# Patient Record
Sex: Male | Born: 2000 | Race: Black or African American | Hispanic: No | Marital: Single | Smoking: Never smoker
Health system: Southern US, Community
[De-identification: ages and names within clinical notes are randomized; demographics above are authoritative.]

---
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
## 2024-03-24 ENCOUNTER — Encounter (HOSPITAL_BASED_OUTPATIENT_CLINIC_OR_DEPARTMENT_OTHER): Payer: Self-pay | Admitting: Emergency Medicine

## 2024-03-24 ENCOUNTER — Other Ambulatory Visit: Payer: Self-pay

## 2024-03-24 ENCOUNTER — Emergency Department (HOSPITAL_BASED_OUTPATIENT_CLINIC_OR_DEPARTMENT_OTHER)
Admission: EM | Admit: 2024-03-24 | Discharge: 2024-03-24 | Disposition: A | Discharge status: Home / Self Care | Attending: Emergency Medicine | Admitting: Emergency Medicine

## 2024-03-24 DIAGNOSIS — K5900 Constipation, unspecified: Secondary | ICD-10-CM | POA: Diagnosis not present

## 2024-03-24 DIAGNOSIS — K6289 Other specified diseases of anus and rectum: Secondary | ICD-10-CM | POA: Diagnosis present

## 2024-03-24 DIAGNOSIS — K649 Unspecified hemorrhoids: Secondary | ICD-10-CM

## 2024-03-24 DIAGNOSIS — K648 Other hemorrhoids: Secondary | ICD-10-CM | POA: Diagnosis not present

## 2024-03-24 DIAGNOSIS — K625 Hemorrhage of anus and rectum: Secondary | ICD-10-CM | POA: Diagnosis not present

## 2024-03-24 MED ORDER — POLYETHYLENE GLYCOL 3350 17 GM/SCOOP PO POWD: 17.0000 g | Freq: Every day | ORAL | 0 refills | Status: AC

## 2024-03-24 MED ORDER — HYDROCORTISONE (PERIANAL) 2.5 % EX CREA: 1.0000 | TOPICAL_CREAM | Freq: Two times a day (BID) | CUTANEOUS | 0 refills | Status: AC

## 2024-03-24 MED ORDER — LIDOCAINE (ANORECTAL) 5 % EX GEL: CUTANEOUS | 0 refills | Status: AC

## 2024-03-24 NOTE — Discharge Instructions (Signed)
 It was a pleasure taking care of you here today  As we discussed on your exam you have a hemorrhoid right at the anal sphincter this is likely where the bleeding is coming from.  I started you on a suppositories to help with the pain.  I would recommend MiraLAX , which I have prescribed or you may also get over-the-counter.  2 scoops 8 ounce glass of water or drink of your choice such as Gatorade, Pedialyte.  1 glass every hour until you start having consistently soft bowel movements.  Please be aware you will most likely still have blood in the toilet when you have bowel movements due to your hemorrhoid.  Make sure to follow-up with your primary care provider as I do recommend following up with a gastroenterologist given this his been ongoing issue.  Return for any new or worsening symptoms

## 2024-03-24 NOTE — ED Triage Notes (Signed)
 Bright red blood with BM today , Hx constipation , rectal sharp pain .  Denies NV .

## 2024-03-24 NOTE — ED Provider Notes (Signed)
 " [place] [facility] Provider Note   CSN: [id number] Arrival date & time: 03/24/24  1702     Patient presents with: Rectal Bleeding   Samuel Park is a 24 y.o. male here for evaluation of rectal pain and rectal bleeding.  Patient states he has a history of constipation.  Few months ago had noted some blood after having a bowel movement, resolved.  Today had a bowel movement which she states had difficulty was due to constipation and he noted bright red blood in the toilet bowl.  He notes some sharp rectal pain.  No rectal swelling, drainage. Has been doing some diet changes to help with pain that he had a few months ago.  He has no current abdominal pain.  He has no personal or family history of colon cancer at an early age, IBD.  No nausea, vomiting, fever, lightheadedness, syncope.  He is not anticoagulated.  He does not know if he has any hemorrhoids.  Mom at bedside   HPI     Prior to Admission medications  Medication Sig Start Date End Date Taking? Authorizing Provider  hydrocortisone  (ANUSOL -HC) 2.5 % rectal cream Place 1 Application rectally 2 (two) times daily. 03/24/24  Yes Donnetta Gillin A, PA-C  Lidocaine , Anorectal, 5 % GEL Apply to rectum for pain twice daily 03/24/24  Yes Aliviya Schoeller A, PA-C  polyethylene glycol powder (MIRALAX ) 17 GM/SCOOP powder Take 17 g by mouth daily. Dissolve 1 capful (17g) in 4-8 ounces of liquid and take by mouth daily. 03/24/24  Yes Lizet Kelso A, PA-C  ibuprofen  (CHILD IBUPROFEN ) 100 MG/5ML suspension Take 20 mLs (400 mg total) by mouth every 6 (six) hours as needed for fever, mild pain or moderate pain. 07/10/14   Anitra Rocky KIDD, PA-C    Allergies: Patient has no known allergies.    Review of Systems  Constitutional: Negative.   HENT: Negative.    Respiratory: Negative.    Cardiovascular: Negative.   Gastrointestinal:  Positive for anal bleeding, constipation and rectal pain. Negative for  abdominal distention, abdominal pain, diarrhea, nausea and vomiting.  Genitourinary: Negative.   Musculoskeletal: Negative.   Skin: Negative.   Neurological: Negative.   All other systems reviewed and are negative.   Updated Vital Signs BP (!) 139/91 (BP Location: Right Arm)   Pulse 63   Temp 97.8 F (36.6 C)   Resp 16   Wt 78.5 kg   SpO2 97%   Physical Exam Vitals and nursing note reviewed. Exam conducted with a chaperone present.  Constitutional:      General: He is not in acute distress.    Appearance: He is well-developed. He is not ill-appearing, toxic-appearing or diaphoretic.  HENT:     Head: Normocephalic and atraumatic.     Nose: Nose normal.     Mouth/Throat:     Mouth: Mucous membranes are moist.  Eyes:     Pupils: Pupils are equal, round, and reactive to light.  Cardiovascular:     Rate and Rhythm: Normal rate and regular rhythm.  Pulmonary:     Effort: Pulmonary effort is normal. No respiratory distress.     Breath sounds: Normal breath sounds.  Abdominal:     General: There is no distension.     Palpations: Abdomen is soft.     Tenderness: There is no abdominal tenderness. There is no right CVA tenderness, left CVA tenderness, guarding or rebound.  Genitourinary:    Comments: Chaperone in room  for exam.  No obvious anal fissure.  No fluctuance, induration.  Hemorrhoid just internal to anal sphincter at the 6 o'clock position.  Light brown stool in rectal vault.  No perineal fluctuance, induration, erythema, warmth or crepitus Musculoskeletal:        General: Normal range of motion.     Cervical back: Normal range of motion and neck supple.  Skin:    General: Skin is warm and dry.  Neurological:     General: No focal deficit present.     Mental Status: He is alert and oriented to person, place, and time.     (all labs ordered are listed, but only abnormal results are displayed) Labs Reviewed - No data to display  EKG: None  Radiology: No results  found.   Procedures   Medications Ordered in the ED - No data to display  24 year old here for evaluation of rectal pain, bleeding and constipation.  Had something similar a few months ago did not seek treatment as it resolved.  Has been having some constipation.  Had a hard bowel movement today and noted in the toilet bowl he had bright red blood.  Here he is afebrile, nonseptic, non-ill-appearing.  He is hemodynamically stable.  He has no personal or family history of colon cancer at an early age, IBD.  On exam he has hemorrhoid just internal to the anal sphincter at the 6 o'clock position, light brown stool in rectal vault.  He has no obvious infectious process on exam.  I cannot visualize any anal fissures.  His abdomen is soft, nontender.  Discussed with patient, family at bedside.  I suspect his symptoms are likely due to constipation and hemorrhoid.  Discussed with him cannot completely rule out things like IBD, colon polyps/ CA.  Will treat his constipation, hemorrhoid.  Will hold on labs and imaging at this time given he is hemodynamically stable and has benign abdominal exam.  Low suspicion for obstruction, perforation, infectious process.  Will have him follow-up with his PCP will likely need follow-up with GI if his symptoms do not improve.  The patient has been appropriately medically screened and/or stabilized in the ED. I have low suspicion for any other emergent medical condition which would require further screening, evaluation or treatment in the ED or require inpatient management.  Patient is hemodynamically stable and in no acute distress.  Patient able to ambulate in department prior to ED.  Evaluation does not show acute pathology that would require ongoing or additional emergent interventions while in the emergency department or further inpatient treatment.  I have discussed the diagnosis with the patient and answered all questions.  Pain is been managed while in the emergency  department and patient has no further complaints prior to discharge.  Patient is comfortable with plan discussed in room and is stable for discharge at this time.  I have discussed strict return precautions for returning to the emergency department.  Patient was encouraged to follow-up with PCP/specialist refer to at discharge.                                   Medical Decision Making Amount and/or Complexity of Data Reviewed Independent Historian: parent External Data Reviewed: labs, radiology and notes.  Risk OTC drugs. Prescription drug management. Decision regarding hospitalization. Diagnosis or treatment significantly limited by social determinants of health.        Final diagnoses:  Rectal  bleeding  Hemorrhoids, unspecified hemorrhoid type  Constipation, unspecified constipation type    ED Discharge Orders          Ordered    hydrocortisone  (ANUSOL -HC) 2.5 % rectal cream  2 times daily        03/24/24 1851    polyethylene glycol powder (MIRALAX ) 17 GM/SCOOP powder  Daily        03/24/24 1851    Lidocaine , Anorectal, 5 % GEL        03/24/24 1851               River Mckercher A, PA-C 03/24/24 1955    Tegeler, Lonni PARAS, MD 03/24/24 2344  "

## 2024-03-24 NOTE — ED Notes (Signed)

## 2024-03-24 NOTE — ED Notes (Signed)
 Pt has a hemorrhoid, only when pooping. No active bleeding othewise.
